# Patient Record
Sex: Female | Born: 2000 | Race: White | Hispanic: No | Marital: Single | Smoking: Never smoker
Health system: Southern US, Community
[De-identification: ages and names within clinical notes are randomized; demographics above are authoritative.]

---
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
## 2019-10-01 ENCOUNTER — Other Ambulatory Visit
Admission: RE | Admit: 2019-10-01 | Discharge: 2019-10-01 | Disposition: A | Discharge status: Home / Self Care | Payer: Self-pay | Source: Ambulatory Visit | Attending: Obstetrics and Gynecology | Admitting: Obstetrics and Gynecology

## 2019-10-01 DIAGNOSIS — N913 Primary oligomenorrhea: Secondary | ICD-10-CM | POA: Insufficient documentation

## 2019-10-01 LAB — TROPONIN I (HIGH SENSITIVITY): Troponin I (High Sensitivity): 3 ng/L (ref <18)

## 2020-06-09 ENCOUNTER — Ambulatory Visit
Admission: EM | Admit: 2020-06-09 | Discharge: 2020-06-09 | Disposition: A | Discharge status: Home / Self Care | Payer: Self-pay | Attending: Emergency Medicine | Admitting: Emergency Medicine

## 2020-06-09 ENCOUNTER — Other Ambulatory Visit: Payer: Self-pay

## 2020-06-09 DIAGNOSIS — G4700 Insomnia, unspecified: Secondary | ICD-10-CM | POA: Insufficient documentation

## 2020-06-09 LAB — PREGNANCY, URINE: Preg Test, Ur: NEGATIVE

## 2020-06-09 NOTE — ED Triage Notes (Addendum)
Pt c/o insomnia for the past month, along with headache and emesis. Pt denies noticing any pattern to her illness. Pt does not have a PCP. Pt has not taken any OTC meds for her symptoms. Pt also reports she has very irregular menstrual cycles, has not had one in several months. She does report being sexually active. States she has taken several pregnancy tests and they are all negative, most recently 2 weeks ago.

## 2020-06-09 NOTE — ED Provider Notes (Signed)
[facility]    CSN: [id number] Arrival date & time: 06/09/20  1025      History   Chief Complaint Chief Complaint  Patient presents with  . Insomnia  . Headache  . Emesis    HPI Alyssa Chung is a 20 y.o. female.   HPI   20 year old female here for evaluation of insomnia, headache, and vomiting.  Patient reports that she has had nightly insomnia for the last month.  She states that she is getting approximately 4 hours of sleep each night.  She goes to bed at 10 and it takes her until 2 or 3 in the morning before she falls asleep.  She has not taken anything over-the-counter for this but she has used marijuana which does help.  Additionally, she has had an intermittent frontal headache that will come and last approximately 2 hours and resolve on its own.  She has not taken any medication up with that either.  She has had 2 episodes of emesis in the past month both were after a coughing fit.  She is also complaining of periumbilical abdominal pain that is been going on for the past month and will come and go.  This is associated with a decreased appetite but no fever or diarrhea.  Her last normal menstrual period was 4 to 5 months ago.  This is an ongoing issue for which she has seen OB in the past and was placed on birth control but she ran out has not been back to get more.  Patient is sexually active with her last sexual encounter being about 5 days ago.  She denies any breast tenderness and she took a home pregnancy test 2 weeks ago that was negative.  Patient reports that she has been evaluated for polycystic ovarian syndrome and also thyroid disease by her gynecologist and it was negative.  History reviewed. No pertinent past medical history.  There are no problems to display for this patient.   History reviewed. No pertinent surgical history.  OB History   No obstetric history on file.      Home Medications    Prior to Admission medications   Not on File     Family History Family History  Problem Relation Age of Onset  . Diabetes Father     Social History Social History   Tobacco Use  . Smoking status: Never Smoker  . Smokeless tobacco: Never Used  Vaping Use  . Vaping Use: Never used  Substance Use Topics  . Alcohol use: Not Currently  . Drug use: Never     Allergies   Patient has no known allergies.   Review of Systems Review of Systems  Constitutional: Positive for appetite change. Negative for activity change and fever.  HENT: Negative for congestion.   Respiratory: Positive for cough. Negative for shortness of breath and wheezing.   Gastrointestinal: Positive for abdominal pain, nausea and vomiting. Negative for constipation and diarrhea.  Genitourinary: Negative for dysuria, frequency, urgency, vaginal bleeding, vaginal discharge and vaginal pain.  Musculoskeletal: Negative for back pain.  Skin: Negative for rash.  Neurological: Positive for headaches.  Hematological: Negative.   Psychiatric/Behavioral: Positive for sleep disturbance.     Physical Exam Triage Vital Signs ED Triage Vitals  Enc Vitals Group     BP --      Pulse Rate 06/09/20 0852 72     Resp 06/09/20 0852 18     Temp 06/09/20 0852 99.2 F (37.3 C)  Temp Source 06/09/20 0852 Oral     SpO2 06/09/20 0852 98 %     Weight 06/09/20 0848 201 lb (91.2 kg)     Height 06/09/20 0848 5\' 7"  (1.702 m)     Head Circumference --      Peak Flow --      Pain Score 06/09/20 0848 0     Pain Loc --      Pain Edu? --      Excl. in GC? --    No data found.  Updated Vital Signs Pulse 72   Temp 99.2 F (37.3 C) (Oral)   Resp 18   Ht 5\' 7"  (1.702 m)   Wt 201 lb (91.2 kg)   SpO2 98%   BMI 31.48 kg/m   Visual Acuity Right Eye Distance:   Left Eye Distance:   Bilateral Distance:    Right Eye Near:   Left Eye Near:    Bilateral Near:     Physical Exam Vitals and nursing note reviewed.  Constitutional:      General: She is not in acute  distress.    Appearance: Normal appearance. She is normal weight. She is not ill-appearing.  HENT:     Head: Normocephalic and atraumatic.     Right Ear: Tympanic membrane, ear canal and external ear normal. There is no impacted cerumen.     Left Ear: Tympanic membrane, ear canal and external ear normal. There is no impacted cerumen.     Mouth/Throat:     Mouth: Mucous membranes are moist.     Pharynx: Oropharynx is clear.  Cardiovascular:     Rate and Rhythm: Normal rate and regular rhythm.     Pulses: Normal pulses.     Heart sounds: Normal heart sounds. No murmur heard. No gallop.   Pulmonary:     Effort: Pulmonary effort is normal.     Breath sounds: Normal breath sounds. No wheezing, rhonchi or rales.  Abdominal:     General: Abdomen is flat. Bowel sounds are normal. There is no distension.     Palpations: Abdomen is soft.     Tenderness: There is abdominal tenderness. There is no guarding or rebound.  Musculoskeletal:     Cervical back: Normal range of motion and neck supple.  Lymphadenopathy:     Cervical: No cervical adenopathy.  Skin:    General: Skin is warm and dry.     Capillary Refill: Capillary refill takes less than 2 seconds.     Findings: No erythema or rash.  Neurological:     General: No focal deficit present.     Mental Status: She is alert and oriented to person, place, and time.  Psychiatric:        Mood and Affect: Mood normal.        Behavior: Behavior normal.        Thought Content: Thought content normal.        Judgment: Judgment normal.      UC Treatments / Results  Labs (all labs ordered are listed, but only abnormal results are displayed) Labs Reviewed  PREGNANCY, URINE    EKG   Radiology No results found.  Procedures Procedures (including critical care time)  Medications Ordered in UC Medications - No data to display  Initial Impression / Assessment and Plan / UC Course  I have reviewed the triage vital signs and the nursing  notes.  Pertinent labs & imaging results that were available during my care of the patient  were reviewed by me and considered in my medical decision making (see chart for details).   Patient is a nontoxic-appearing 20 year old female here for evaluation of multiple complaints as outlined in the HPI above.  Patient's physical exam reveals a benign upper respiratory exam.  Cardiopulmonary exam is also benign.  No cervical lymphadenopathy appreciated exam.  Abdomen is flat, soft, with positive bowel sounds in all 4 quadrants.  Patient has mild left lower quadrant and suprapubic tenderness without guarding or rebound.  No right lower quadrant tenderness or tenderness of McBurney's point.  Patient states that she has a bowel movement daily with her last one being yesterday.  Patient does have irregular menstrual cycles with the last 1 being 4 to 5 months ago.  She did see OB/GYN in November 2021 and was placed on oral contraceptives at that time.  Patient reports that the oral contraceptive pills helped her symptoms but she ran out and has not been back to get more.  We will check urine pregnancy test here.  If her test is negative we will have her start using over-the-counter Unisom to help with her sleep issues as well as discussed sleep hygiene and will have her follow-up with her OB/GYN to get restarted on her oral contraceptive pills.  We will also make referral to help patient find a primary care provider.  Urine pregnancy test is negative.  Will discharge patient home with a diagnosis of insomnia and have her start using over-the-counter Unisom nightly according to the package instructions to help with her insomnia symptoms.  I have also discussed sleep hygiene with the patient.  Patient is to follow-up with her OB/GYN for reevaluation and to reestablish oral contraceptives as this was helping her headaches and irregular menses.   Final Clinical Impressions(s) / UC Diagnoses   Final diagnoses:   Insomnia, unspecified type     Discharge Instructions     You need to make an appointment with your OB/GYN for reevaluation and reinitiation of your oral contraceptive pills as I believe this will go a little bit helping your insomnia and your headaches.  This will also help your irregular menses.  For your insomnia you can try taking over-the-counter melatonin or over-the-counter Unisom according to the package instructions nightly.  Guided meditation and good sleep hygiene practices are also helpful.    ED Prescriptions    None     PDMP not reviewed this encounter.   Becky Augusta, NP 06/09/20 [phone]

## 2020-06-09 NOTE — Discharge Instructions (Signed)
You need to make an appointment with your OB/GYN for reevaluation and reinitiation of your oral contraceptive pills as I believe this will go a little bit helping your insomnia and your headaches.  This will also help your irregular menses.  For your insomnia you can try taking over-the-counter melatonin or over-the-counter Unisom according to the package instructions nightly.  Guided meditation and good sleep hygiene practices are also helpful.

## 2020-08-16 ENCOUNTER — Ambulatory Visit
Admission: EM | Admit: 2020-08-16 | Discharge: 2020-08-16 | Disposition: A | Discharge status: Home / Self Care | Payer: 59 | Attending: Physician Assistant | Admitting: Physician Assistant

## 2020-08-16 ENCOUNTER — Ambulatory Visit (INDEPENDENT_AMBULATORY_CARE_PROVIDER_SITE_OTHER): Payer: 59

## 2020-08-16 ENCOUNTER — Other Ambulatory Visit: Payer: Self-pay

## 2020-08-16 DIAGNOSIS — M25571 Pain in right ankle and joints of right foot: Secondary | ICD-10-CM

## 2020-08-16 DIAGNOSIS — Z8781 Personal history of (healed) traumatic fracture: Secondary | ICD-10-CM | POA: Diagnosis not present

## 2020-08-16 NOTE — ED Provider Notes (Signed)
[facility]    CSN: [id number] Arrival date & time: 08/16/20  1508      History   Chief Complaint Chief Complaint  Patient presents with   Ankle Pain    HPI Alyssa Chung is a 20 y.o. female presenting for right ankle pain off and on for the past 5 to 6 years.  Patient states that she fractured a part of her ankle a few years ago and was treated by having to wear a cast.  Patient states that her ankle "has not been right since."  She denies any recent injury but states the pain seem to be worse after she was jogging 2 days ago.  She has not taken any medication to help with pain relief.  She is also not applied ice and does not have an ankle brace.  Patient says that she works at [employer] and having to stand for a long period of time makes the pain worse.  Patient would like a work note.  She denies any numbness or tingling.  No other complaints.  HPI  History reviewed. No pertinent past medical history.  There are no problems to display for this patient.   History reviewed. No pertinent surgical history.  OB History   No obstetric history on file.      Home Medications    Prior to Admission medications   Not on File    Family History Family History  Problem Relation Age of Onset   Diabetes Father     Social History Social History   Tobacco Use   Smoking status: Never   Smokeless tobacco: Never  Vaping Use   Vaping Use: Never used  Substance Use Topics   Alcohol use: Not Currently   Drug use: Never     Allergies   Patient has no known allergies.   Review of Systems Review of Systems  Musculoskeletal:  Positive for arthralgias. Negative for gait problem and joint swelling.  Skin:  Negative for color change and wound.  Neurological:  Negative for weakness and numbness.    Physical Exam Triage Vital Signs ED Triage Vitals  Enc Vitals Group     BP 08/16/20 1600 128/75     Pulse Rate 08/16/20 1600 75     Resp 08/16/20 1600 20      Temp 08/16/20 1600 98.6 F (37 C)     Temp src --      SpO2 08/16/20 1600 99 %     Weight --      Height --      Head Circumference --      Peak Flow --      Pain Score 08/16/20 1559 5     Pain Loc --      Pain Edu? --      Excl. in GC? --    No data found.  Updated Vital Signs BP 128/75   Pulse 75   Temp 98.6 F (37 C)   Resp 20   LMP  (LMP Unknown)   SpO2 99%      Physical Exam Vitals and nursing note reviewed.  Constitutional:      General: She is not in acute distress.    Appearance: Normal appearance. She is not ill-appearing or toxic-appearing.  HENT:     Head: Normocephalic and atraumatic.  Eyes:     General: No scleral icterus.       Right eye: No discharge.        Left eye:  No discharge.     Conjunctiva/sclera: Conjunctivae normal.  Cardiovascular:     Rate and Rhythm: Normal rate and regular rhythm.     Pulses: Normal pulses.  Pulmonary:     Effort: Pulmonary effort is normal. No respiratory distress.  Musculoskeletal:     Cervical back: Neck supple.     Right ankle: No swelling, deformity or ecchymosis. Tenderness present over the ATF ligament (TTP also of talus). Normal range of motion.  Skin:    General: Skin is dry.  Neurological:     General: No focal deficit present.     Mental Status: She is alert. Mental status is at baseline.     Motor: No weakness.     Gait: Gait normal.  Psychiatric:        Mood and Affect: Mood normal.        Behavior: Behavior normal.        Thought Content: Thought content normal.     UC Treatments / Results  Labs (all labs ordered are listed, but only abnormal results are displayed) Labs Reviewed - No data to display  EKG   Radiology DG Ankle Complete Right  Result Date: 08/16/2020 CLINICAL DATA:  Injury while running. Right ankle pain after running 2 days ago. Patient reports prior fracture 4 years ago. EXAM: RIGHT ANKLE - COMPLETE 3+ VIEW COMPARISON:  None. FINDINGS: No acute or healing fracture. Normal  alignment. The ankle mortise is preserved. No ankle joint effusion. Base of fifth metatarsal is intact. There is no evidence of arthropathy or other focal bone abnormality. Soft tissues are unremarkable. IMPRESSION: Negative radiographs of the right ankle. Electronically Signed   By: Narda Rutherford M.D.   On: 08/16/2020 16:33    Procedures Procedures (including critical care time)  Medications Ordered in UC Medications - No data to display  Initial Impression / Assessment and Plan / UC Course  I have reviewed the triage vital signs and the nursing notes.  Pertinent labs & imaging results that were available during my care of the patient were reviewed by me and considered in my medical decision making (see chart for details).  20 year old female presenting for right ankle pain that is chronic but worse over the past couple days.  No specific injury.  Given her history of fracture, x-ray obtained today.  X-ray reviewed and normal.  Overread shows negative radiographs of the right ankle.  I reviewed this with patient.  At this time I advised abortive care with following RICE guidelines and taking ibuprofen/Tylenol as needed for pain.  We have given her an ankle brace as well.  Advised patient given the duration of her pain I would advise that she see orthopedics again in case she needs another type of imaging or physical therapy.  Work note given.  Final Clinical Impressions(s) / UC Diagnoses   Final diagnoses:  Acute right ankle pain  History of fracture of right ankle     Discharge Instructions      You may needANKLE PAIN: X-rays are normal today. Stressed avoiding painful activities . Reviewed RICE guidelines. Use medications as directed, including NSAIDs. If no NSAIDs have been prescribed for you today, you may take Aleve or Motrin over the counter. May use Tylenol in between doses of NSAIDs.  If no improvement in the next 1-2 weeks, f/u with PCP or return to our office for  reexamination, and please feel free to call or return at any time for any questions or concerns you may have  and we will be happy to help you!      Consider following up with orthopedics since this is been coming and going for the past couple of years following a fracture.  Further imaging.   ED Prescriptions   None    PDMP not reviewed this encounter.   Shirlee Latch, PA-C 08/16/20 1711

## 2020-08-16 NOTE — ED Triage Notes (Signed)
Pt experiencing right ankle pain after jogging 2 days ago, pt reports fracture of same about 4 years ago

## 2020-08-16 NOTE — Discharge Instructions (Addendum)
You may needANKLE PAIN: X-rays are normal today. Stressed avoiding painful activities . Reviewed RICE guidelines. Use medications as directed, including NSAIDs. If no NSAIDs have been prescribed for you today, you may take Aleve or Motrin over the counter. May use Tylenol in between doses of NSAIDs.  If no improvement in the next 1-2 weeks, f/u with PCP or return to our office for reexamination, and please feel free to call or return at any time for any questions or concerns you may have and we will be happy to help you!      Consider following up with orthopedics since this is been coming and going for the past couple of years following a fracture.  Further imaging.

## 2021-12-19 IMAGING — CR DG ANKLE COMPLETE 3+V*R*
3 series · 3 of 3 positions shown · non-contrast
Comparison: None.

CLINICAL DATA: Injury while running. Right ankle pain after running
2 days ago. Patient reports prior fracture 4 years ago.

EXAM:
RIGHT ANKLE - COMPLETE 3+ VIEW

[ankle ap]
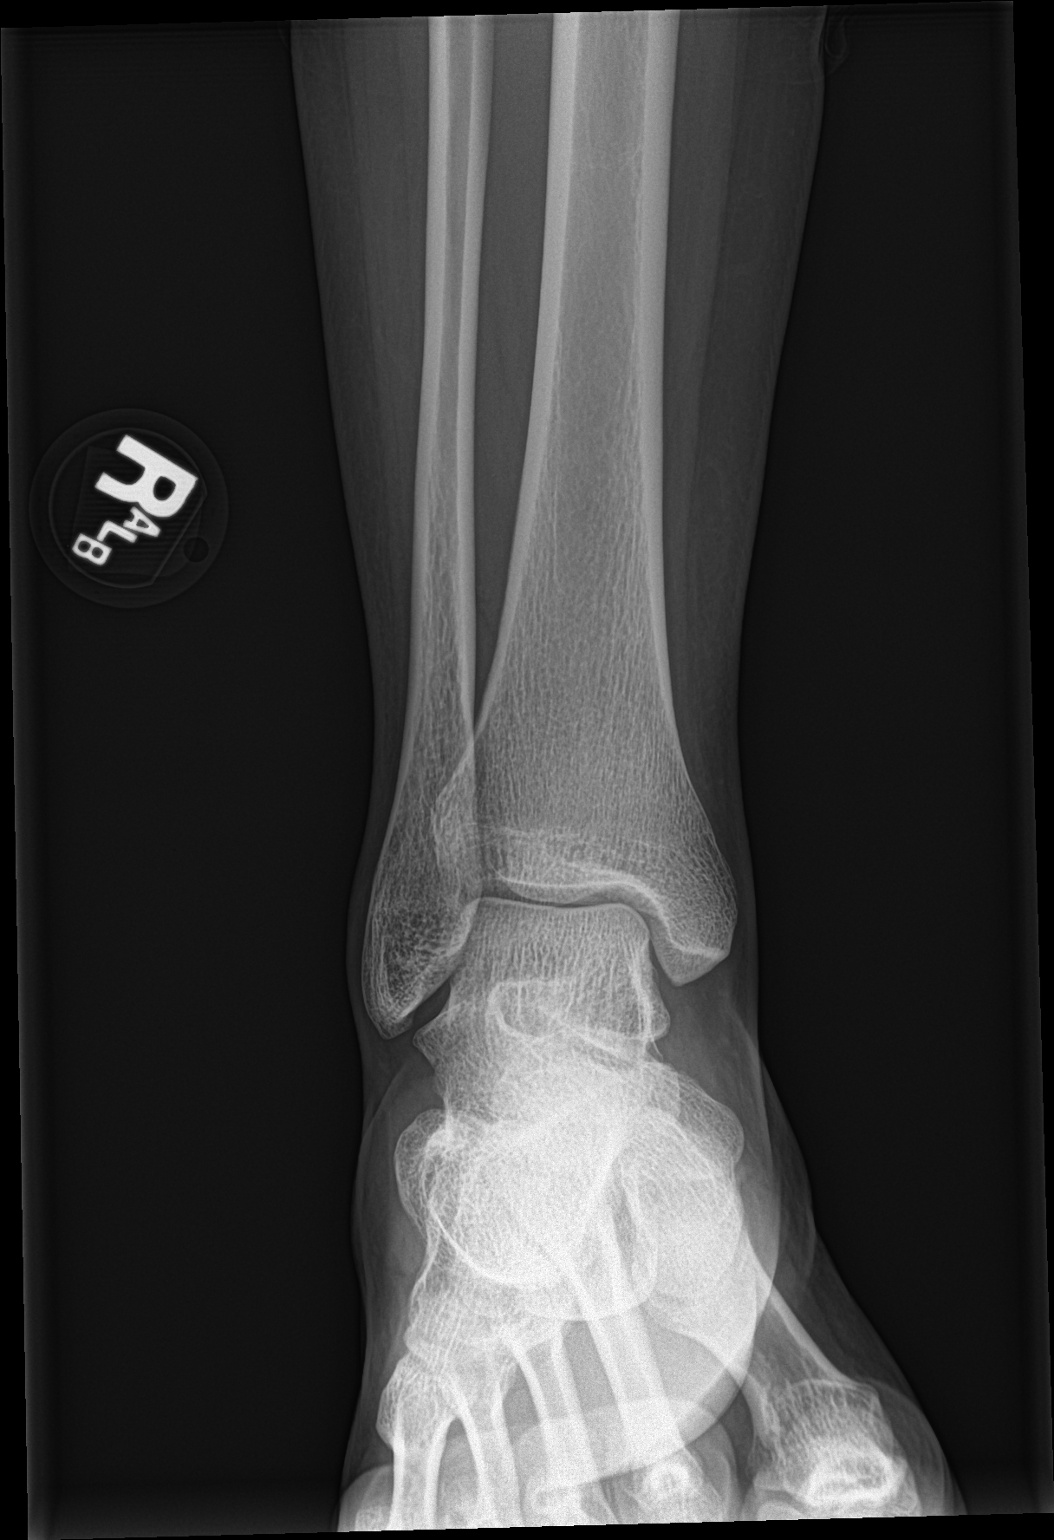

[ankle obl]
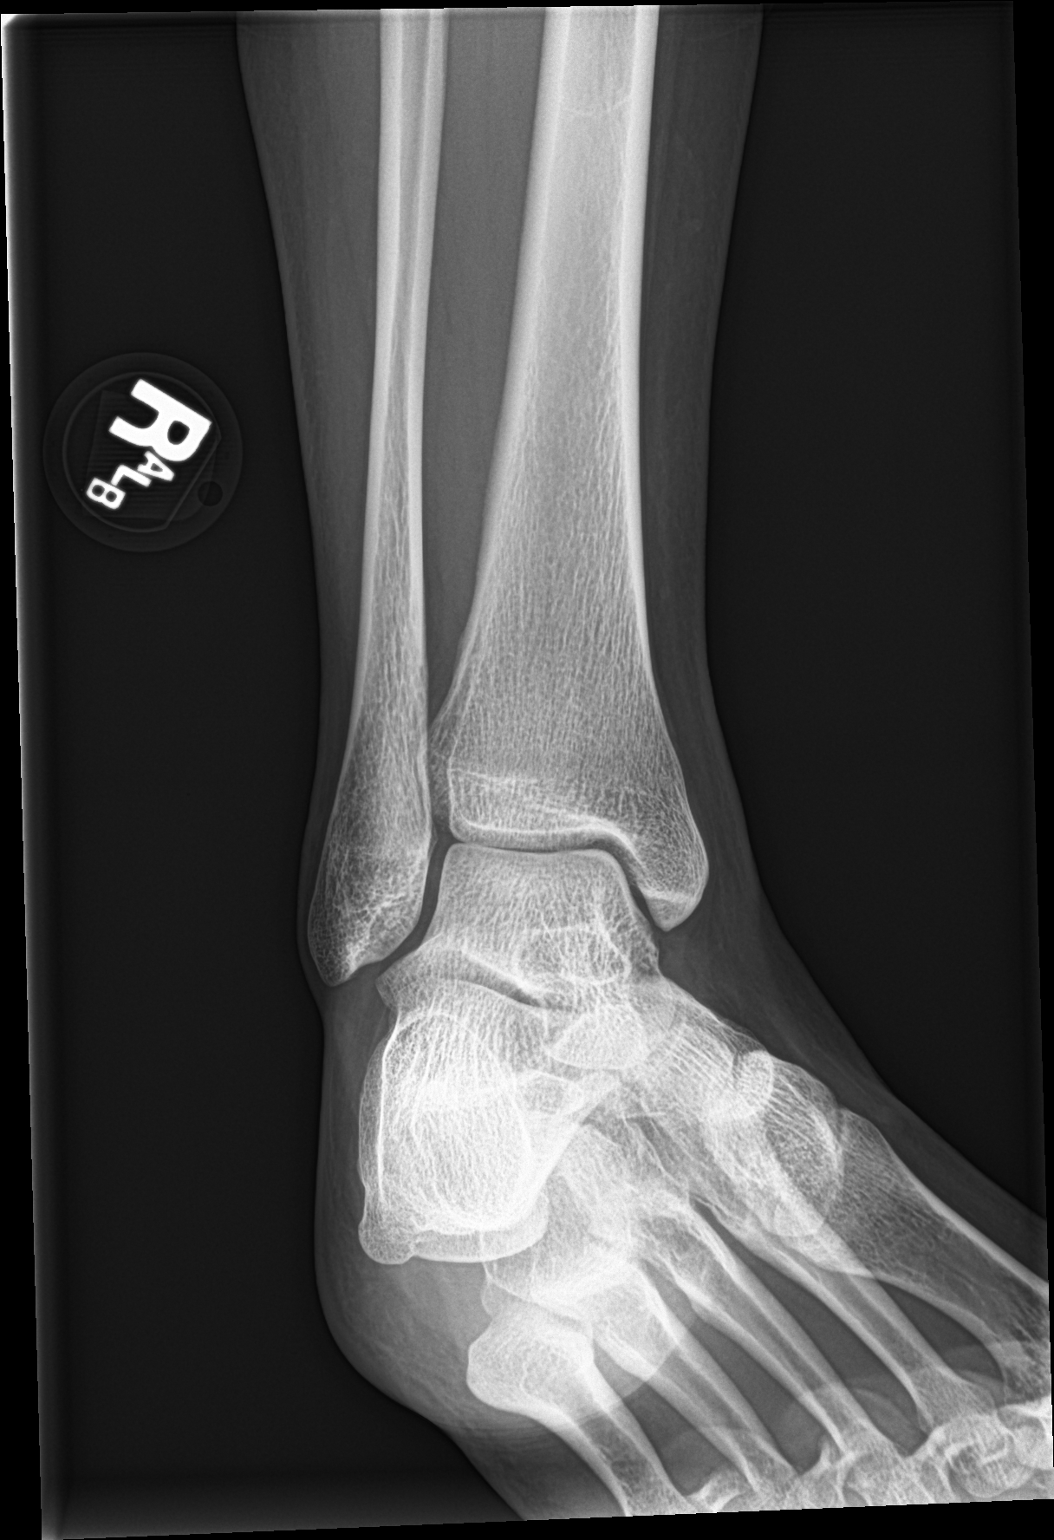

[ankle lat]
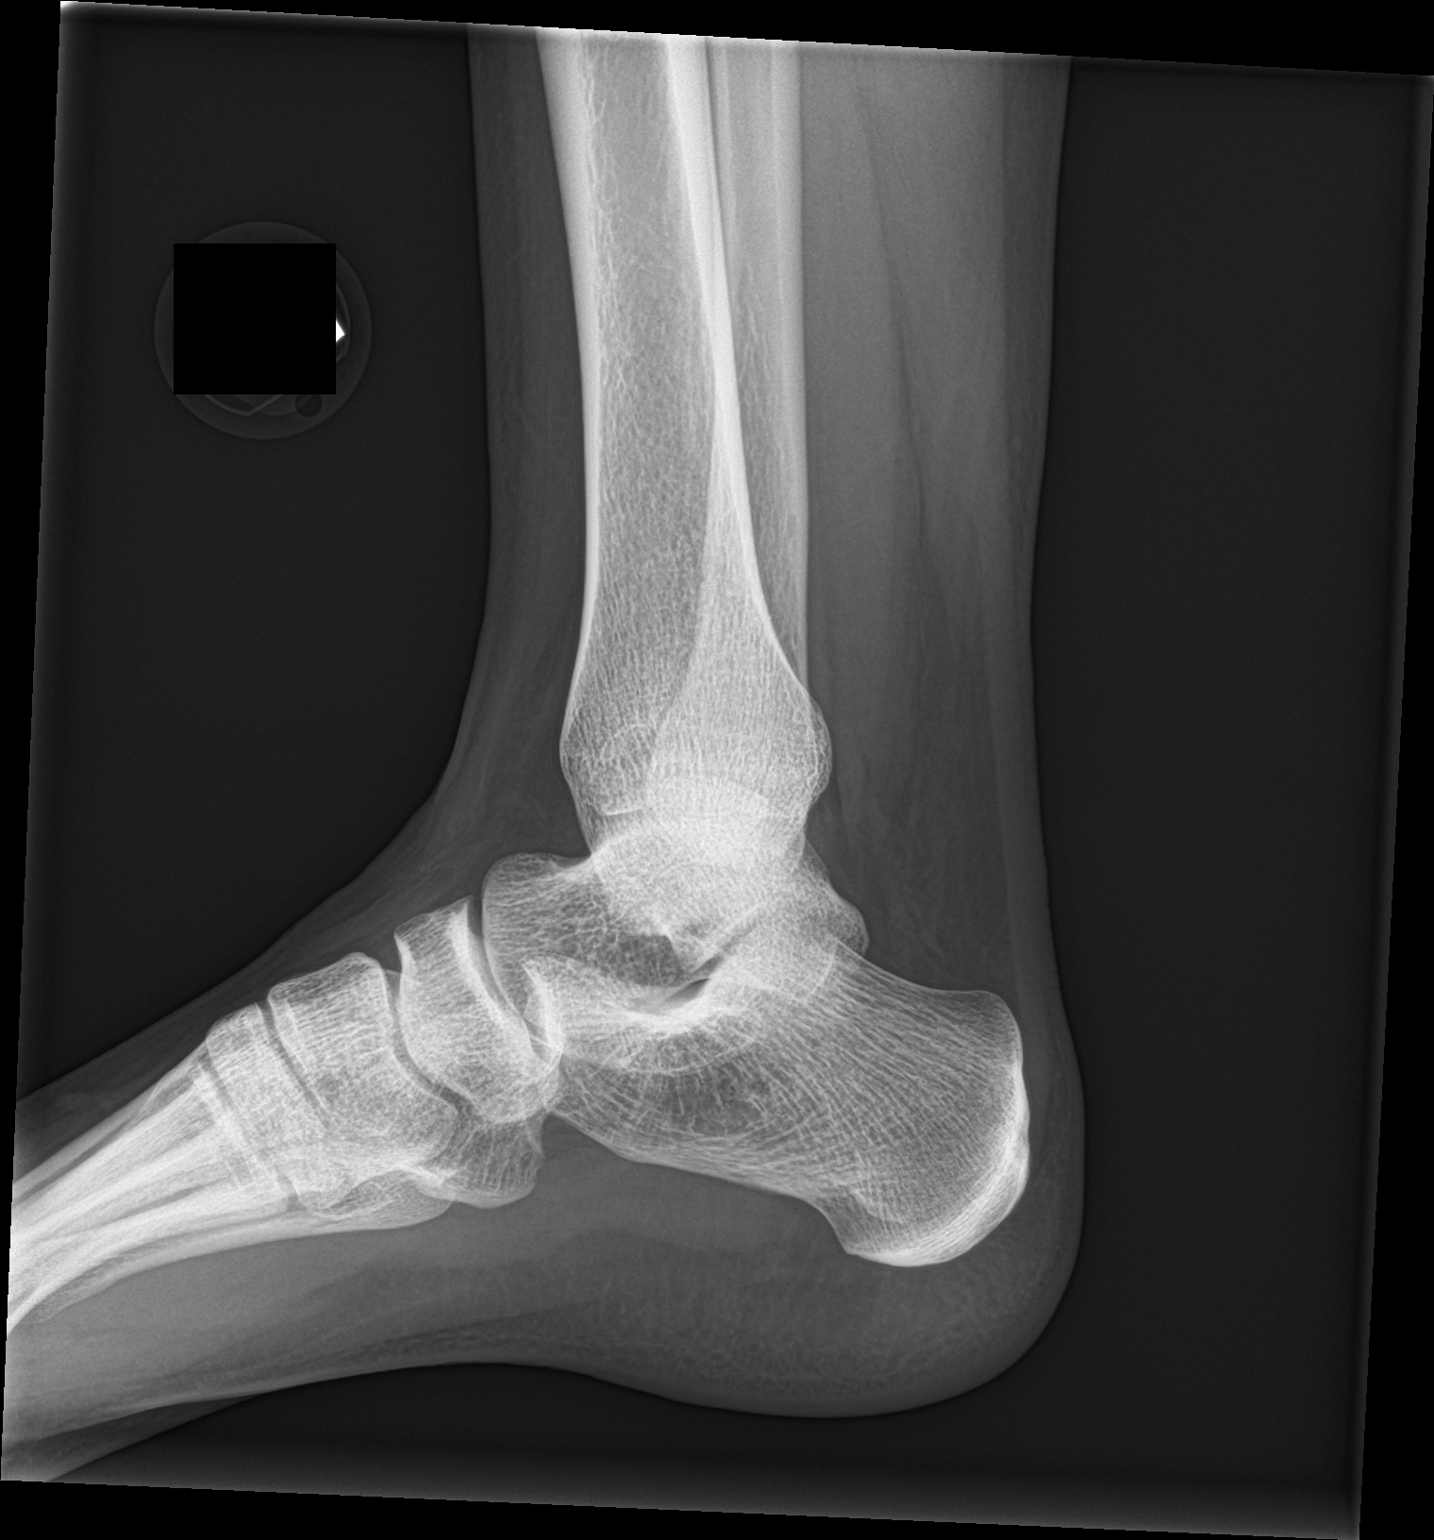

[3 of 3 positions shown; findings below may reference images not displayed]

FINDINGS: No acute or healing fracture. Normal alignment. The ankle mortise is
preserved. No ankle joint effusion. Base of fifth metatarsal is
intact. There is no evidence of arthropathy or other focal bone
abnormality. Soft tissues are unremarkable.
IMPRESSION: Negative radiographs of the right ankle.
